# Patient Record
Sex: Female | Born: 2005 | Race: White | Hispanic: No | Marital: Single | Smoking: Never smoker
Health system: Southern US, Community
[De-identification: ages and names within clinical notes are randomized; demographics above are authoritative.]

## PROBLEM LIST (undated history)

## (undated) DIAGNOSIS — J302 Other seasonal allergic rhinitis: Secondary | ICD-10-CM

## (undated) DIAGNOSIS — F642 Gender identity disorder of childhood: Secondary | ICD-10-CM

## (undated) DIAGNOSIS — F419 Anxiety disorder, unspecified: Secondary | ICD-10-CM

## (undated) DIAGNOSIS — F32A Depression, unspecified: Secondary | ICD-10-CM

## (undated) HISTORY — DX: Depression, unspecified: F32.A

## (undated) HISTORY — DX: Anxiety disorder, unspecified: F41.9

## (undated) HISTORY — DX: Gender identity disorder of childhood: F64.2

## (undated) HISTORY — DX: Other seasonal allergic rhinitis: J30.2

---
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
## 2024-02-21 ENCOUNTER — Encounter: Payer: Self-pay | Admitting: *Deleted
